# Patient Record
Sex: Female | Born: 1980 | Race: White | Hispanic: No | Marital: Single | Smoking: Former smoker
Health system: Southern US, Community
[De-identification: ages and names within clinical notes are randomized; demographics above are authoritative.]

## PROBLEM LIST (undated history)

## (undated) DIAGNOSIS — Z789 Other specified health status: Secondary | ICD-10-CM

## (undated) DIAGNOSIS — O26899 Other specified pregnancy related conditions, unspecified trimester: Secondary | ICD-10-CM

## (undated) DIAGNOSIS — Z6791 Unspecified blood type, Rh negative: Secondary | ICD-10-CM

## (undated) HISTORY — DX: Other specified pregnancy related conditions, unspecified trimester: O26.899

## (undated) HISTORY — DX: Unspecified blood type, rh negative: Z67.91

---

## 2009-11-12 ENCOUNTER — Ambulatory Visit: Payer: Self-pay | Admitting: Gynecology

## 2009-11-12 ENCOUNTER — Inpatient Hospital Stay (HOSPITAL_COMMUNITY): Admission: AD | Admit: 2009-11-12 | Discharge: 2009-11-12 | Payer: Self-pay | Admitting: Obstetrics & Gynecology

## 2010-04-16 ENCOUNTER — Ambulatory Visit (HOSPITAL_COMMUNITY): Admission: AD | Admit: 2010-04-16 | Discharge: 2010-04-16 | Payer: Self-pay | Admitting: Obstetrics and Gynecology

## 2010-06-30 ENCOUNTER — Inpatient Hospital Stay (HOSPITAL_COMMUNITY)
Admission: AD | Admit: 2010-06-30 | Discharge: 2010-07-02 | Payer: Self-pay | Source: Home / Self Care | Attending: Obstetrics and Gynecology | Admitting: Obstetrics and Gynecology

## 2010-07-01 LAB — CBC
HCT: 34.7 % — ABNORMAL LOW (ref 36.0–46.0)
Hemoglobin: 10.6 g/dL — ABNORMAL LOW (ref 12.0–15.0)
Hemoglobin: 7.9 g/dL — ABNORMAL LOW (ref 12.0–15.0)
MCH: 23.4 pg — ABNORMAL LOW (ref 26.0–34.0)
MCHC: 30.9 g/dL (ref 30.0–36.0)
MCV: 75.7 fL — ABNORMAL LOW (ref 78.0–100.0)
Platelets: 239 10*3/uL (ref 150–400)
Platelets: 182 10*3/uL (ref 150–400)
RBC: 3.38 MIL/uL — ABNORMAL LOW (ref 3.87–5.11)
RDW: 16.4 % — ABNORMAL HIGH (ref 11.5–15.5)
RDW: 16.2 % — ABNORMAL HIGH (ref 11.5–15.5)
WBC: 19.4 10*3/uL — ABNORMAL HIGH (ref 4.0–10.5)

## 2010-07-01 LAB — RPR: RPR Ser Ql: NONREACTIVE

## 2010-07-02 LAB — RH IMMUNE GLOB WKUP(>/=20WKS)(NOT WOMEN'S HOSP): Fetal Screen: NEGATIVE

## 2010-07-02 LAB — CBC
Hemoglobin: 8.4 g/dL — ABNORMAL LOW (ref 12.0–15.0)
MCH: 24.1 pg — ABNORMAL LOW (ref 26.0–34.0)
MCHC: 31.2 g/dL (ref 30.0–36.0)
Platelets: 187 10*3/uL (ref 150–400)
RDW: 16.5 % — ABNORMAL HIGH (ref 11.5–15.5)
WBC: 17 10*3/uL — ABNORMAL HIGH (ref 4.0–10.5)

## 2010-08-18 LAB — RH IMMUNE GLOBULIN WORKUP (NOT WOMEN'S HOSP): ABO/RH(D): O NEG

## 2010-08-24 LAB — URINE MICROSCOPIC-ADD ON

## 2010-08-24 LAB — WET PREP, GENITAL
Trich, Wet Prep: NONE SEEN
Yeast Wet Prep HPF POC: NONE SEEN

## 2010-08-24 LAB — HCG, QUANTITATIVE, PREGNANCY: hCG, Beta Chain, Quant, S: 31697 m[IU]/mL — ABNORMAL HIGH (ref <5)

## 2010-08-24 LAB — CBC
HCT: 41.7 % (ref 36.0–46.0)
RBC: 4.31 MIL/uL (ref 3.87–5.11)
RDW: 13.1 % (ref 11.5–15.5)

## 2010-08-24 LAB — URINALYSIS, ROUTINE W REFLEX MICROSCOPIC
Bilirubin Urine: NEGATIVE
Ketones, ur: NEGATIVE mg/dL
Urobilinogen, UA: 0.2 mg/dL (ref 0.0–1.0)

## 2010-08-24 LAB — URINE CULTURE

## 2010-08-24 LAB — RH IMMUNE GLOBULIN WORKUP (NOT WOMEN'S HOSP): Antibody Screen: NEGATIVE

## 2010-08-24 LAB — GC/CHLAMYDIA PROBE AMP, GENITAL
Chlamydia, DNA Probe: NEGATIVE
GC Probe Amp, Genital: NEGATIVE

## 2010-08-24 LAB — ABO/RH: ABO/RH(D): O NEG

## 2011-07-14 IMAGING — US US OB COMP LESS 14 WK
2 series · 14 of 28 positions shown · non-contrast
Comparison: none

OBSTETRICAL ULTRASOUND:
 This ultrasound exam was performed in the [HOSPITAL] Ultrasound Department.  The OB US report was generated in the AS system, and faxed to the ordering physician.  This report is also available in [HOSPITAL]?s AccessANYware and in [REDACTED] PACS.

[Series 1: us ob comp less 14 wks · 26 acquisitions, 10 frames shown (1 of 2)]
[im 2/26]
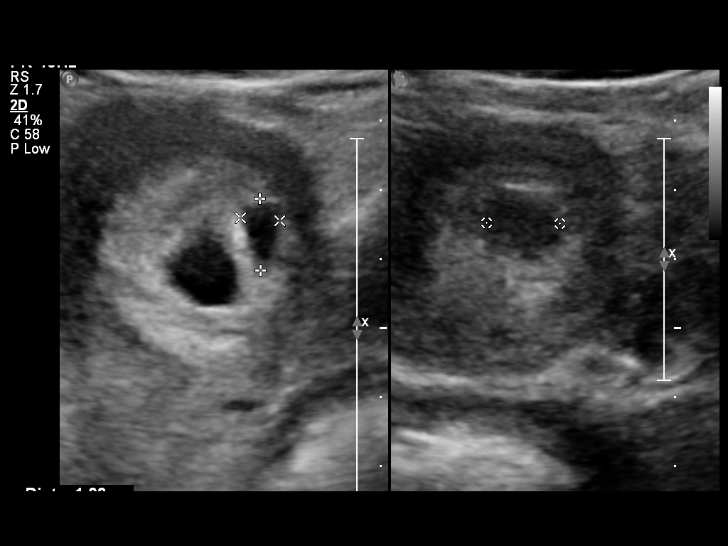
[im 4/26]
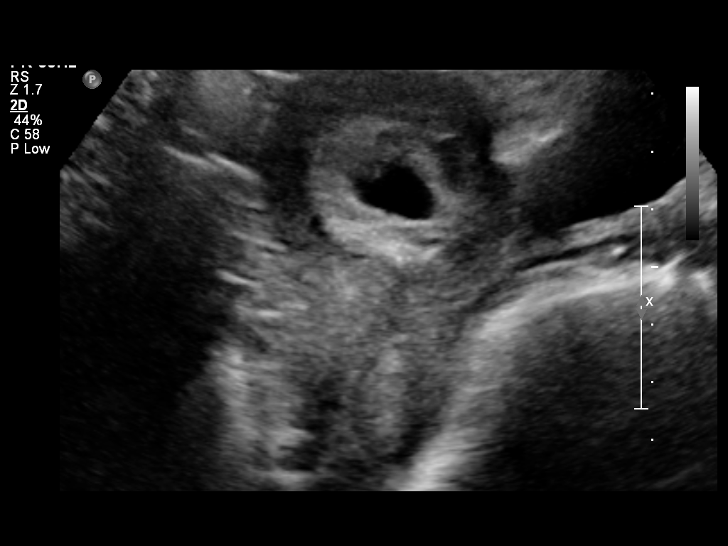
[im 7/26]
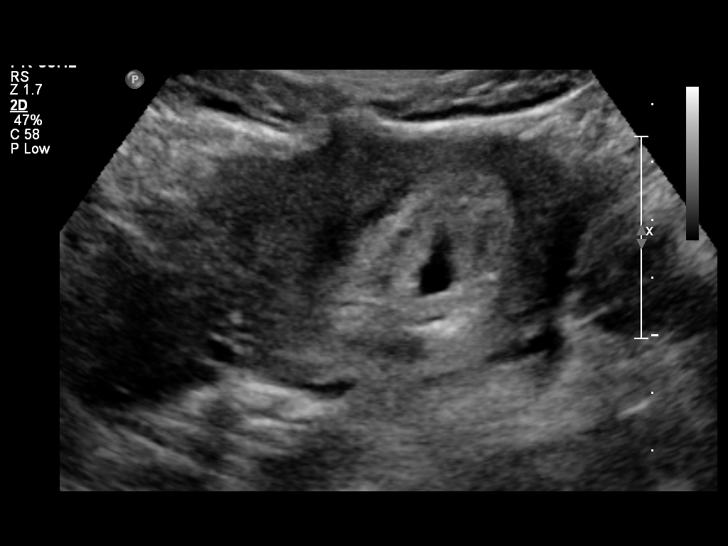
[im 9/26]
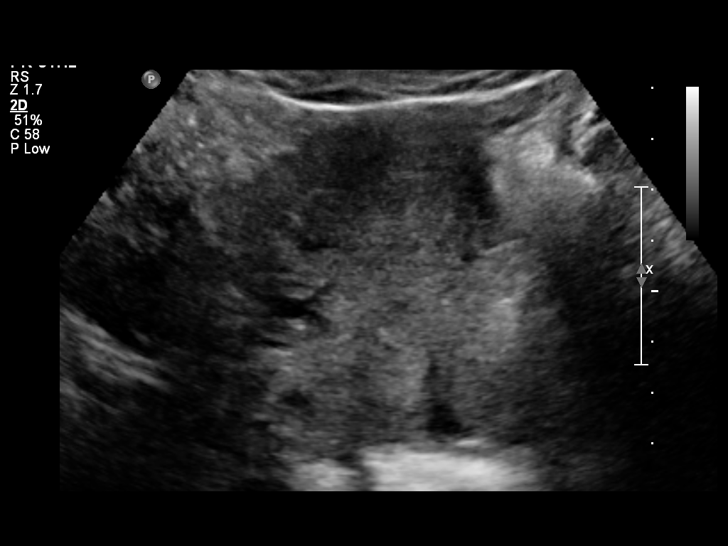
[im 12/26]
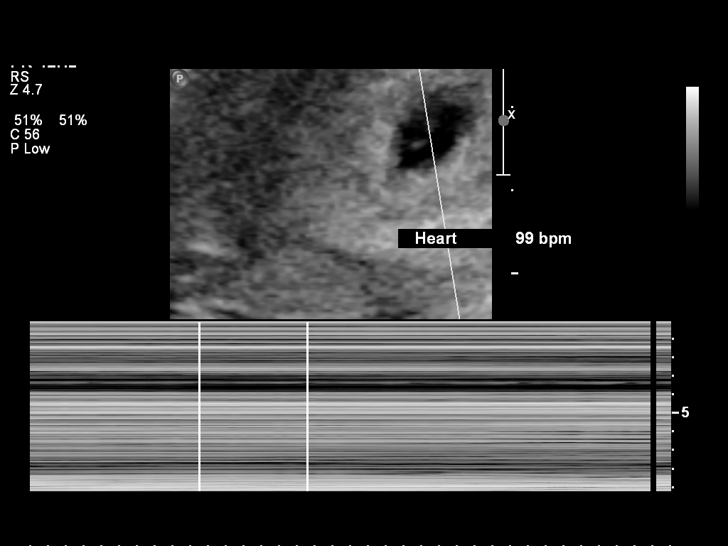
[im 14/26]
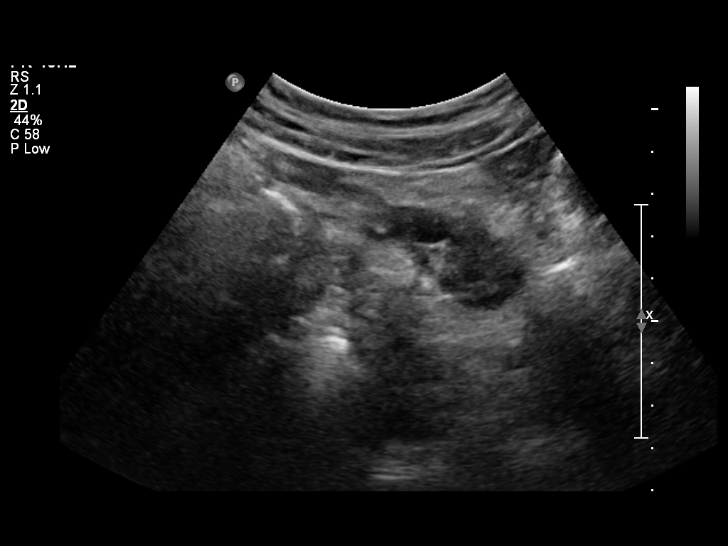
[im 17/26]
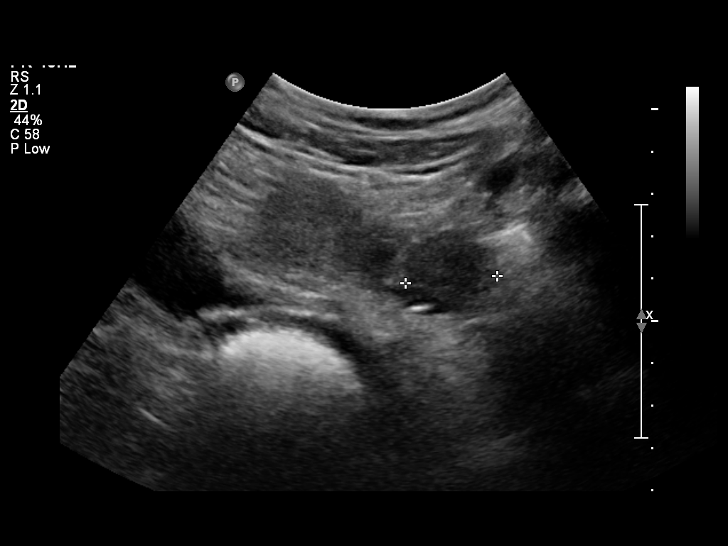
[im 19/26]
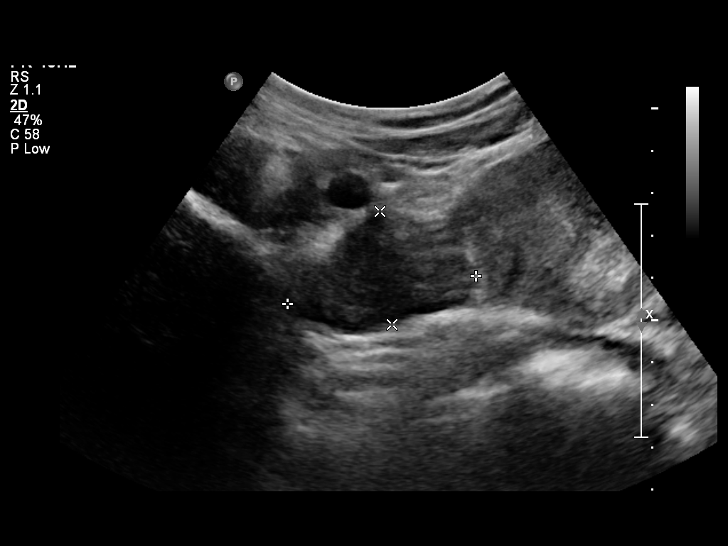
[im 22/26]
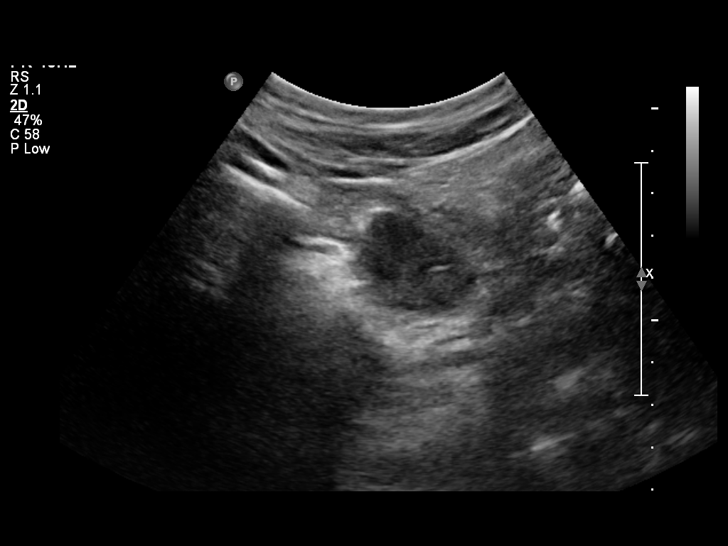
[im 24/26]
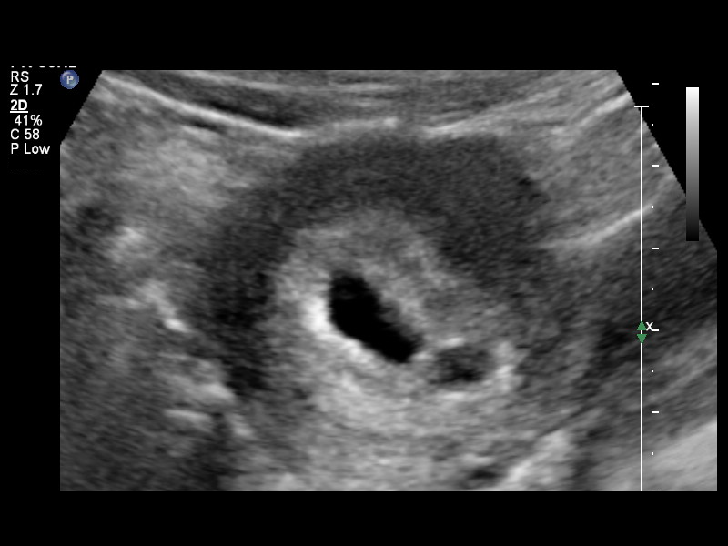

[Series 1: us ob comp less 14 wks · 4 of 8 slices shown (2 of 2)]
[im 1/8]
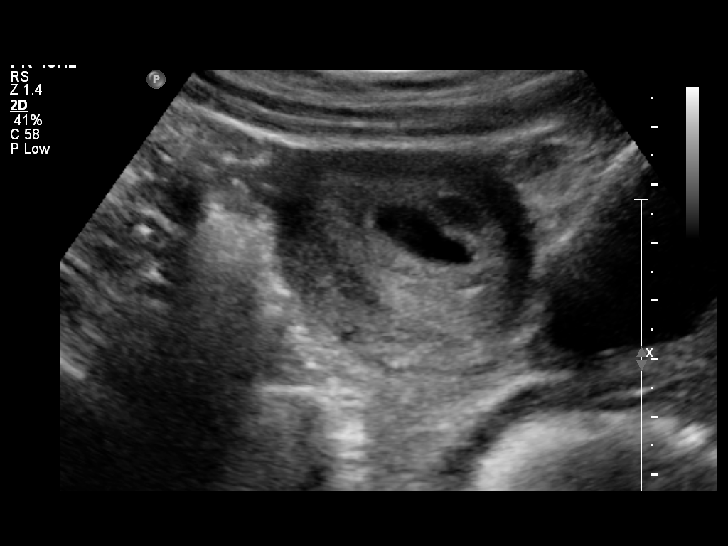
[im 3/8]
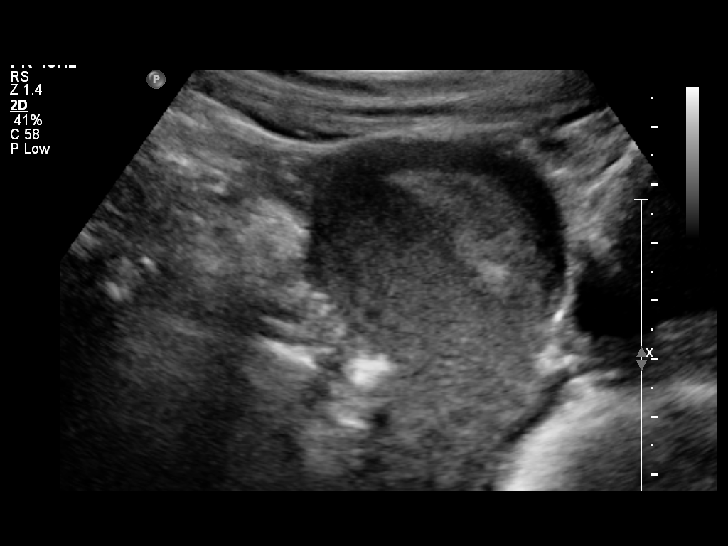
[im 5/8]
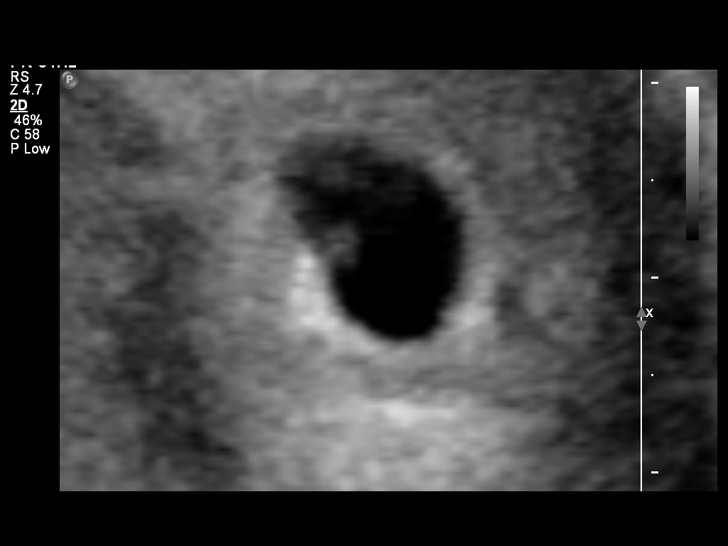
[im 8/8]
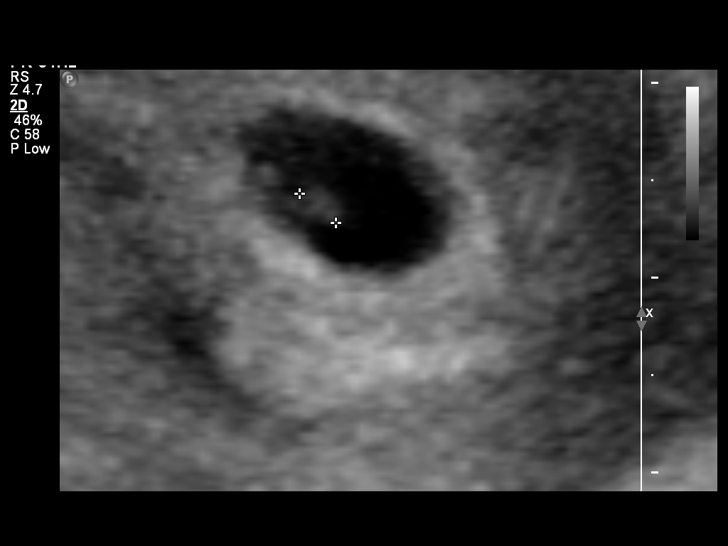

[14 of 28 positions shown; findings below may reference images not displayed]

IMPRESSION: See AS Obstetric US report.

## 2016-06-07 NOTE — L&D Delivery Note (Signed)
Delivery Note Patient pushed well for 10 minutes. At 3:44 PM a viable female was delivered via Vaginal, Spontaneous (Presentation: LOA).  APGAR: 8, 9; weight pending.   Placenta status:spontaneous and in tact.  Cord: 3V  with the following complications: None.  Cord pH: n/a  Anesthesia:  Epidural Episiotomy: None Lacerations: None Suture Repair: n/a Est. Blood Loss (mL): 100  Mom to postpartum.  Baby to Couplet care / Skin to Skin.  Blayke Cordrey GEFFEL Naiara Lombardozzi 06/02/2017, 4:16 PM

## 2016-10-28 ENCOUNTER — Ambulatory Visit (INDEPENDENT_AMBULATORY_CARE_PROVIDER_SITE_OTHER): Payer: Self-pay

## 2016-10-28 ENCOUNTER — Encounter: Payer: Self-pay | Admitting: Obstetrics

## 2016-10-28 DIAGNOSIS — Z3201 Encounter for pregnancy test, result positive: Secondary | ICD-10-CM

## 2016-10-28 DIAGNOSIS — Z6791 Unspecified blood type, Rh negative: Secondary | ICD-10-CM

## 2016-10-28 DIAGNOSIS — N926 Irregular menstruation, unspecified: Secondary | ICD-10-CM

## 2016-10-28 LAB — POCT URINE PREGNANCY: Preg Test, Ur: POSITIVE — AB

## 2016-10-28 NOTE — Progress Notes (Signed)
Nurse visit for UPT only pt states she does not have insurance. UPT positive.

## 2016-10-28 NOTE — Progress Notes (Deleted)
Ms. Olivia Summers presents today for UPT. She has no complaints today. LMP: 09/01/16    OBJECTIVE: Appears well, in no apparent distress.  OB History    No data available     Home UPT Result: Positive In-Office UPT result: Positive I have reviewed the patient's medical, obstetrical, social, and family histories, and medications.   ASSESSMENT: Positive pregnancy test  PLAN [facility] [place]  Prenatal Samples given

## 2016-11-24 LAB — OB RESULTS CONSOLE ABO/RH: RH Type: NEGATIVE

## 2016-11-24 LAB — OB RESULTS CONSOLE HEPATITIS B SURFACE ANTIGEN: Hepatitis B Surface Ag: NEGATIVE

## 2016-11-24 LAB — OB RESULTS CONSOLE RPR: RPR: NONREACTIVE

## 2016-11-24 LAB — OB RESULTS CONSOLE ANTIBODY SCREEN: ANTIBODY SCREEN: NEGATIVE

## 2016-11-24 LAB — OB RESULTS CONSOLE GC/CHLAMYDIA
CHLAMYDIA, DNA PROBE: NEGATIVE
GC PROBE AMP, GENITAL: NEGATIVE

## 2016-11-24 LAB — OB RESULTS CONSOLE HIV ANTIBODY (ROUTINE TESTING): HIV: NONREACTIVE

## 2016-11-24 LAB — OB RESULTS CONSOLE RUBELLA ANTIBODY, IGM: RUBELLA: IMMUNE

## 2017-05-11 LAB — OB RESULTS CONSOLE GBS: GBS: NEGATIVE

## 2017-06-01 ENCOUNTER — Telehealth (HOSPITAL_COMMUNITY): Payer: Self-pay | Admitting: *Deleted

## 2017-06-01 ENCOUNTER — Other Ambulatory Visit: Payer: Self-pay | Admitting: Obstetrics and Gynecology

## 2017-06-01 ENCOUNTER — Encounter (HOSPITAL_COMMUNITY): Payer: Self-pay | Admitting: *Deleted

## 2017-06-01 NOTE — Telephone Encounter (Signed)
Preadmission screen  

## 2017-06-02 ENCOUNTER — Inpatient Hospital Stay (HOSPITAL_COMMUNITY): Payer: Medicaid Other | Admitting: Anesthesiology

## 2017-06-02 ENCOUNTER — Inpatient Hospital Stay (HOSPITAL_COMMUNITY)
Admission: RE | Admit: 2017-06-02 | Discharge: 2017-06-03 | DRG: 807 | Disposition: A | Discharge status: Home / Self Care | Payer: Medicaid Other | Source: Ambulatory Visit | Attending: Obstetrics | Admitting: Obstetrics

## 2017-06-02 ENCOUNTER — Encounter (HOSPITAL_COMMUNITY): Payer: Self-pay

## 2017-06-02 ENCOUNTER — Other Ambulatory Visit: Payer: Self-pay

## 2017-06-02 DIAGNOSIS — O26893 Other specified pregnancy related conditions, third trimester: Principal | ICD-10-CM | POA: Diagnosis present

## 2017-06-02 DIAGNOSIS — Z87891 Personal history of nicotine dependence: Secondary | ICD-10-CM

## 2017-06-02 HISTORY — DX: Other specified health status: Z78.9

## 2017-06-02 LAB — CBC
HEMATOCRIT: 32.7 % — AB (ref 36.0–46.0)
HEMOGLOBIN: 10.3 g/dL — AB (ref 12.0–15.0)
MCH: 26 pg (ref 26.0–34.0)
MCHC: 31.5 g/dL (ref 30.0–36.0)
MCV: 82.6 fL (ref 78.0–100.0)
PLATELETS: 268 10*3/uL (ref 150–400)
RBC: 3.96 MIL/uL (ref 3.87–5.11)
RDW: 14.4 % (ref 11.5–15.5)
WBC: 13.7 10*3/uL — AB (ref 4.0–10.5)

## 2017-06-02 LAB — RPR: RPR Ser Ql: NONREACTIVE

## 2017-06-02 MED ORDER — VITAMIN K1 1 MG/0.5ML IJ SOLN
INTRAMUSCULAR | Status: AC
  Filled 2017-06-02: qty 0.5

## 2017-06-02 MED ORDER — DIPHENHYDRAMINE HCL 25 MG PO CAPS: 25.0000 mg | ORAL_CAPSULE | Freq: Four times a day (QID) | ORAL | Status: DC | PRN

## 2017-06-02 MED ORDER — ONDANSETRON HCL 4 MG/2ML IJ SOLN: 4.0000 mg | Freq: Four times a day (QID) | INTRAMUSCULAR | Status: DC | PRN

## 2017-06-02 MED ORDER — ACETAMINOPHEN 325 MG PO TABS: 650.0000 mg | ORAL_TABLET | ORAL | Status: DC | PRN

## 2017-06-02 MED ORDER — LACTATED RINGERS IV SOLN
INTRAVENOUS | Status: DC
  Administered 2017-06-02: 08:00:00 via INTRAVENOUS

## 2017-06-02 MED ORDER — OXYTOCIN 40 UNITS IN LACTATED RINGERS INFUSION - SIMPLE MED: 2.5000 [IU]/h | INTRAVENOUS | Status: DC

## 2017-06-02 MED ORDER — ONDANSETRON HCL 4 MG PO TABS: 4.0000 mg | ORAL_TABLET | ORAL | Status: DC | PRN

## 2017-06-02 MED ORDER — DIPHENHYDRAMINE HCL 50 MG/ML IJ SOLN: 12.5000 mg | INTRAMUSCULAR | Status: DC | PRN

## 2017-06-02 MED ORDER — OXYCODONE-ACETAMINOPHEN 5-325 MG PO TABS: 2.0000 | ORAL_TABLET | ORAL | Status: DC | PRN

## 2017-06-02 MED ORDER — SIMETHICONE 80 MG PO CHEW: 80.0000 mg | CHEWABLE_TABLET | ORAL | Status: DC | PRN

## 2017-06-02 MED ORDER — OXYCODONE-ACETAMINOPHEN 5-325 MG PO TABS: 1.0000 | ORAL_TABLET | ORAL | Status: DC | PRN

## 2017-06-02 MED ORDER — BENZOCAINE-MENTHOL 20-0.5 % EX AERO: 1.0000 "application " | INHALATION_SPRAY | CUTANEOUS | Status: DC | PRN

## 2017-06-02 MED ORDER — COCONUT OIL OIL: 1.0000 "application " | TOPICAL_OIL | Status: DC | PRN

## 2017-06-02 MED ORDER — OXYTOCIN BOLUS FROM INFUSION
500.0000 mL | Freq: Once | INTRAVENOUS | Status: AC
  Administered 2017-06-02: 500 mL via INTRAVENOUS

## 2017-06-02 MED ORDER — WITCH HAZEL-GLYCERIN EX PADS: 1.0000 "application " | MEDICATED_PAD | CUTANEOUS | Status: DC | PRN

## 2017-06-02 MED ORDER — PHENYLEPHRINE 40 MCG/ML (10ML) SYRINGE FOR IV PUSH (FOR BLOOD PRESSURE SUPPORT)
80.0000 ug | PREFILLED_SYRINGE | INTRAVENOUS | Status: DC | PRN
  Filled 2017-06-02: qty 5

## 2017-06-02 MED ORDER — OXYTOCIN 40 UNITS IN LACTATED RINGERS INFUSION - SIMPLE MED
1.0000 m[IU]/min | INTRAVENOUS | Status: DC
  Administered 2017-06-02: 2 m[IU]/min via INTRAVENOUS
  Filled 2017-06-02: qty 1000

## 2017-06-02 MED ORDER — LIDOCAINE HCL (PF) 1 % IJ SOLN
INTRAMUSCULAR | Status: DC | PRN
  Administered 2017-06-02: 4 mL
  Administered 2017-06-02: 6 mL via EPIDURAL

## 2017-06-02 MED ORDER — SENNOSIDES-DOCUSATE SODIUM 8.6-50 MG PO TABS
2.0000 | ORAL_TABLET | ORAL | Status: DC
  Administered 2017-06-02: 2 via ORAL
  Filled 2017-06-02: qty 2

## 2017-06-02 MED ORDER — PHENYLEPHRINE 40 MCG/ML (10ML) SYRINGE FOR IV PUSH (FOR BLOOD PRESSURE SUPPORT)
80.0000 ug | PREFILLED_SYRINGE | INTRAVENOUS | Status: DC | PRN
  Filled 2017-06-02: qty 10
  Filled 2017-06-02: qty 5

## 2017-06-02 MED ORDER — OXYCODONE HCL 5 MG PO TABS: 10.0000 mg | ORAL_TABLET | ORAL | Status: DC | PRN

## 2017-06-02 MED ORDER — ONDANSETRON HCL 4 MG/2ML IJ SOLN: 4.0000 mg | INTRAMUSCULAR | Status: DC | PRN

## 2017-06-02 MED ORDER — SOD CITRATE-CITRIC ACID 500-334 MG/5ML PO SOLN: 30.0000 mL | ORAL | Status: DC | PRN

## 2017-06-02 MED ORDER — EPHEDRINE 5 MG/ML INJ
10.0000 mg | INTRAVENOUS | Status: DC | PRN
  Filled 2017-06-02: qty 2

## 2017-06-02 MED ORDER — LACTATED RINGERS IV SOLN
500.0000 mL | INTRAVENOUS | Status: DC | PRN
Start: 2017-06-02 — End: 2017-06-02

## 2017-06-02 MED ORDER — DIBUCAINE 1 % RE OINT: 1.0000 "application " | TOPICAL_OINTMENT | RECTAL | Status: DC | PRN

## 2017-06-02 MED ORDER — FENTANYL 2.5 MCG/ML BUPIVACAINE 1/10 % EPIDURAL INFUSION (WH - ANES)
14.0000 mL/h | INTRAMUSCULAR | Status: DC | PRN
  Administered 2017-06-02: 14 mL/h via EPIDURAL
  Filled 2017-06-02: qty 100

## 2017-06-02 MED ORDER — PRENATAL MULTIVITAMIN CH
1.0000 | ORAL_TABLET | Freq: Every day | ORAL | Status: DC
  Administered 2017-06-03: 1 via ORAL
  Filled 2017-06-02: qty 1

## 2017-06-02 MED ORDER — LACTATED RINGERS IV SOLN: 500.0000 mL | Freq: Once | INTRAVENOUS | Status: DC

## 2017-06-02 MED ORDER — OXYCODONE HCL 5 MG PO TABS: 5.0000 mg | ORAL_TABLET | ORAL | Status: DC | PRN

## 2017-06-02 MED ORDER — IBUPROFEN 600 MG PO TABS
600.0000 mg | ORAL_TABLET | Freq: Four times a day (QID) | ORAL | Status: DC
  Administered 2017-06-02 – 2017-06-03 (×4): 600 mg via ORAL
  Filled 2017-06-02 (×4): qty 1

## 2017-06-02 MED ORDER — LIDOCAINE HCL (PF) 1 % IJ SOLN
30.0000 mL | INTRAMUSCULAR | Status: DC | PRN
  Filled 2017-06-02: qty 30

## 2017-06-02 MED ORDER — TETANUS-DIPHTH-ACELL PERTUSSIS 5-2.5-18.5 LF-MCG/0.5 IM SUSP: 0.5000 mL | Freq: Once | INTRAMUSCULAR | Status: DC

## 2017-06-02 MED ORDER — TERBUTALINE SULFATE 1 MG/ML IJ SOLN
0.2500 mg | Freq: Once | INTRAMUSCULAR | Status: DC | PRN
  Filled 2017-06-02: qty 1

## 2017-06-02 MED ORDER — EPHEDRINE 5 MG/ML INJ
10.0000 mg | INTRAVENOUS | Status: DC | PRN
Start: 2017-06-02 — End: 2017-06-02
  Filled 2017-06-02: qty 2

## 2017-06-02 NOTE — Lactation Note (Signed)
This note was copied from a baby's chart. Lactation Consultation Note  Patient Name: Olivia Summers Today's Date: 06/02/2017 Reason for consult: Initial assessment   P3, Baby 4 hours old.  Ex BF for 1.5 & 2 years. Oral assessment indicated short anterior labial frenulum. Older brother had a frenotomy as an infant. Reviewed hand expression with drops expressed. Reviewed spoon feeding. Assisted w/ latching in cross cradle, cradle and football positions. Most of the time, mother felt comfortable w/ latch and at times she felt pinching. Encouraged parents to discuss frenulum w/ Ped MD. Provided family w/ resource sheet. Mom encouraged to feed baby 8-12 times/24 hours and with feeding cues.  Mom made aware of O/P services, breastfeeding support groups, community resources, and our phone # for post-discharge questions.     Maternal Data Has patient been taught Hand Expression?: Yes Does the patient have breastfeeding experience prior to this delivery?: Yes  Feeding Feeding Type: Breast Fed Length of feed: 10 min  LATCH Score Latch: Grasps breast easily, tongue down, lips flanged, rhythmical sucking.  Audible Swallowing: A few with stimulation  Type of Nipple: Everted at rest and after stimulation  Comfort (Breast/Nipple): Soft / non-tender  Hold (Positioning): Assistance needed to correctly position infant at breast and maintain latch.  LATCH Score: 8  Interventions Interventions: Breast feeding basics reviewed;Assisted with latch;Skin to skin;Breast massage;Hand express;Breast compression;Support pillows;Position options  Lactation Tools Discussed/Used     Consult Status Consult Status: Follow-up Date: 06/03/17 Follow-up type: In-patient    Dahlia ByesBerkelhammer, Ruth [facility] 06/02/2017, 7:51 PM

## 2017-06-02 NOTE — Anesthesia Pain Management Evaluation Note (Signed)
  CRNA Pain Management Visit Note  Patient: Olivia Summers, 36 y.o., female  "Hello I am a member of the anesthesia team at [facility]. We have an anesthesia team available at all times to provide care throughout the hospital, including epidural management and anesthesia for C-section. I don't know your plan for the delivery whether it a natural birth, water birth, IV sedation, nitrous supplementation, doula or epidural, but we want to meet your pain goals."   1.Was your pain managed to your expectations on prior hospitalizations?   Yes   2.What is your expectation for pain management during this hospitalization?     Epidural  3.How can we help you reach that goal? epidural  Record the patient's initial score and the patient's pain goal.   Pain: 0  Pain Goal: 4 [facility] wants you to be able to say your pain was always managed very well.  Keyna Blizard 06/02/2017

## 2017-06-02 NOTE — Anesthesia Preprocedure Evaluation (Signed)
Anesthesia Evaluation  Patient identified by MRN, date of birth, ID band Patient awake    Reviewed: Allergy & Precautions, H&P , Patient's Chart, lab work & pertinent test results  Airway Mallampati: II  TM Distance: >3 FB Neck ROM: full    Dental  (+) Teeth Intact   Pulmonary former smoker,    breath sounds clear to auscultation       Cardiovascular  Rhythm:regular Rate:Normal     Neuro/Psych    GI/Hepatic   Endo/Other    Renal/GU      Musculoskeletal   Abdominal   Peds  Hematology   Anesthesia Other Findings       Reproductive/Obstetrics (+) Pregnancy                             Anesthesia Physical  Anesthesia Plan  ASA: II  Anesthesia Plan: Epidural   Post-op Pain Management:    Induction:   PONV Risk Score and Plan:   Airway Management Planned:   Additional Equipment:   Intra-op Plan:   Post-operative Plan:   Informed Consent: I have reviewed the patients History and Physical, chart, labs and discussed the procedure including the risks, benefits and alternatives for the proposed anesthesia with the patient or authorized representative who has indicated his/her understanding and acceptance.   Dental Advisory Given  Plan Discussed with:   Anesthesia Plan Comments: (Labs checked- platelets confirmed with RN in room. Fetal heart tracing, per RN, reported to be stable enough for sitting procedure. Discussed epidural, and patient consents to the procedure:  included risk of possible headache,backache, failed block, allergic reaction, and nerve injury. This patient was asked if she had any questions or concerns before the procedure started.)        Anesthesia Quick Evaluation  

## 2017-06-02 NOTE — Anesthesia Procedure Notes (Signed)
Epidural Patient location during procedure: OB  Staffing Anesthesiologist: Finn Altemose, MD  Preanesthetic Checklist Completed: patient identified, pre-op evaluation, timeout performed, IV checked, risks and benefits discussed and monitors and equipment checked  Epidural Patient position: sitting Prep: DuraPrep Patient monitoring: blood pressure and continuous pulse ox Approach: right paramedian Location: L3-L4 Injection technique: LOR air  Needle:  Needle type: Tuohy  Needle gauge: 17 G Needle insertion depth: 5 cm Catheter type: closed end flexible Catheter size: 19 Gauge Catheter at skin depth: 10 cm Test dose: negative  Assessment Sensory level: T8  Additional Notes          

## 2017-06-02 NOTE — H&P (Signed)
[age] y.o. [id number] @ 6347w1d presents for  IOL for AMA.  Otherwise has good fetal movement and no bleeding.  Past Medical History:  Diagnosis Date  . Medical history non-contributory   . Rh negative, antepartum    History reviewed. No pertinent surgical history.  OB History  Gravida Para Term Preterm AB Living  3 2 2     2   SAB TAB Ectopic Multiple Live Births          2    # Outcome Date GA Lbr Len/2nd Weight Sex Delivery Anes PTL Lv  3 Current           2 Term 06/30/10 7644w0d  3.459 kg (7 lb 10 oz) F Vag-Spont EPI  LIV  1 Term 07/11/08 8643w0d  3.657 kg (8 lb 1 oz) M Vag-Spont EPI  LIV      Social History   Socioeconomic History  . Marital status: Single    Spouse name: Not on file  . Number of children: Not on file  . Years of education: Not on file  . Highest education level: Not on file  Tobacco Use  . Smoking status: Former Smoker    Types: Cigarettes    Last attempt to quit: 10/06/2016    Years since quitting: 0.6  . Smokeless tobacco: Never Used  Substance and Sexual Activity  . Alcohol use: Yes    Comment: socially, prior ot +UPT  . Drug use: No  . Sexual activity: Yes    Birth control/protection: None  Other Topics Concern  . Not on file  Social History Narrative  . Not on file   Patient has no known allergies.    Prenatal Transfer Tool  Maternal Diabetes: No Genetic Screening: Normal Maternal Ultrasounds/Referrals: Normal Fetal Ultrasounds or other Referrals:  None Maternal Substance Abuse:  No Significant Maternal Medications:  None Significant Maternal Lab Results: Lab values include: Group B Strep negative  ABO, Rh: O/Negative/-- (06/20 0000) Antibody: Negative (06/20 0000) Rubella: Immune (06/20 0000) RPR: Nonreactive (06/20 0000)  HBsAg: Negative (06/20 0000)  HIV: Non-reactive (06/20 0000)  GBS: Negative (12/05 0000)     Other PNC: uncomplicated.    Vitals:   06/02/17 0804 06/02/17 0818  BP: 124/79   Pulse: 97   Resp: 18   Temp:  98.1  F (36.7 C)     General:  NAD Abdomen:  soft, gravid, EFW 7.5# Ex:  no edema SVE:  3/50/-2 FHTs:  140s, mod var, + accels, cat1 Toco:  quiet   A/P   36 y.o. [id number] 2947w1d presents with IOL for AMA Admit to L&D IOL with pitocin Epidural upon request  FSR/ vtx/ GBS neg  [facility] Inc[facility]NA GEFFEL Tarin Navarez

## 2017-06-02 NOTE — Progress Notes (Signed)
Increasing discomfort w ctx  Toco--irregular ctx EFM: 130s, mod var, + accels, cat 1 SVE: 4/50/-2, AROM clear fluid  A/P:  g3p2 @ 2536w1d w IOL for AMA Cont pitocin FSR/vtx / gbs neg

## 2017-06-03 ENCOUNTER — Ambulatory Visit: Payer: Self-pay

## 2017-06-03 LAB — CBC
HEMATOCRIT: 31 % — AB (ref 36.0–46.0)
HEMOGLOBIN: 9.9 g/dL — AB (ref 12.0–15.0)
MCH: 26.2 pg (ref 26.0–34.0)
MCHC: 31.9 g/dL (ref 30.0–36.0)
MCV: 82 fL (ref 78.0–100.0)
Platelets: 225 10*3/uL (ref 150–400)
RBC: 3.78 MIL/uL — AB (ref 3.87–5.11)
RDW: 14.6 % (ref 11.5–15.5)
WBC: 16.9 10*3/uL — ABNORMAL HIGH (ref 4.0–10.5)

## 2017-06-03 MED ORDER — RHO D IMMUNE GLOBULIN 1500 UNIT/2ML IJ SOSY
300.0000 ug | PREFILLED_SYRINGE | Freq: Once | INTRAMUSCULAR | Status: AC
  Administered 2017-06-03: 300 ug via INTRAVENOUS
  Filled 2017-06-03: qty 2

## 2017-06-03 NOTE — Lactation Note (Signed)
This note was copied from a baby's chart. Lactation Consultation Note  Patient Name: Olivia Summers ZOXWR'UToday's Date: 06/03/2017 Reason for consult: Follow-up assessment;Term(early D/C, mom packed up for D/C and ready to go. see LC  note )  Per mom having some discomfort with latching.  LC reviewed basics and recommended prior to latch - breast massage , hand express, pre- pump with hand pump to prime  Milk ducts, reverse pressure,latch with firm support and breast compressions until swallows and then intermittent.  Shells between feedings except when sleeping to enhance a deeper latch.  Sore nipple and engorgement prevention and tx reviewed.  LC instructed mom on the use of shells and hand pump.  Mother informed of post-discharge support and given phone number to the lactation department, including services for phone call assistance; out-patient appointments; and breastfeeding support group. List of other breastfeeding resources in the community given in the handout. Encouraged mother to call for problems or concerns related to breastfeeding.    Maternal Data Has patient been taught Hand Expression?: (per mom feels comfortable )  Feeding    LATCH Score                   Interventions Interventions: Breast feeding basics reviewed;Hand pump;Shells  Lactation Tools Discussed/Used Tools: Shells;Pump Shell Type: Inverted Breast pump type: Manual Pump Review: Setup, frequency, and cleaning Initiated by:: MAI  Date initiated:: 06/03/17   Consult Status Consult Status: Complete Date: 06/03/17    Olivia Summers 06/03/2017, 6:19 PM

## 2017-06-03 NOTE — Discharge Summary (Signed)
Obstetric Discharge Summary Reason for Admission: induction of labor Prenatal Procedures: NST and ultrasound Intrapartum Procedures: spontaneous vaginal delivery Postpartum Procedures: none Complications-Operative and Postpartum: none Hemoglobin  Date Value Ref Range Status  06/03/2017 9.9 (L) 12.0 - 15.0 g/dL Final   HCT  Date Value Ref Range Status  06/03/2017 31.0 (L) 36.0 - 46.0 % Final    Physical Exam:  General: alert and cooperative Lochia: appropriate Uterine Fundus: firm  Discharge Diagnoses: Term Pregnancy-delivered and AMA  Discharge Information: Date: 06/03/2017 Activity: pelvic rest Diet: routine Medications: PNV and Ibuprofen Condition: stable Instructions: refer to practice specific booklet Discharge to: home Follow-up Information    Marlow Baarslark, Dyanna, MD. Schedule an appointment as soon as possible for a visit in 1 month(s).   Specialty:  Obstetrics Contact information: [address] [place] [address] [phone]           Newborn Data: Live born female  Birth Weight: 7 lb 3.9 oz (3286 g) APGAR: 8, 9  Newborn Delivery   Birth date/time:  06/02/2017 15:44:00 Delivery type:  Vaginal, Spontaneous     Home with mother.  Olivia Summers 06/03/2017, 1:52 PM

## 2017-06-03 NOTE — Anesthesia Postprocedure Evaluation (Signed)
Anesthesia Post Note  Patient: Olivia Summers  Procedure(s) Performed: AN AD HOC LABOR EPIDURAL     Patient location during evaluation: Mother Baby Anesthesia Type: Epidural Level of consciousness: awake, awake and alert, oriented and patient cooperative Pain management: pain level controlled Vital Signs Assessment: post-procedure vital signs reviewed and stable Respiratory status: spontaneous breathing, nonlabored ventilation and respiratory function stable Cardiovascular status: stable Postop Assessment: no headache, no backache, patient able to bend at knees and no apparent nausea or vomiting Anesthetic complications: no    Last Vitals:  Vitals:   06/02/17 1823 06/02/17 2235  BP: 110/66 99/61  Pulse: 73 75  Resp: 18 18  Temp: 36.6 C 36.5 C  SpO2: 100% 99%    Last Pain:  Vitals:   06/03/17 0530  TempSrc:   PainSc: 2    Pain Goal:                 Ayana Imhof L

## 2017-06-03 NOTE — Progress Notes (Signed)
PPD#1 Pt is doing well . She would like to go home. Lochia wnl VSSAF IMP/ Stable  Plan/ Will discharge

## 2017-06-04 LAB — RH IG WORKUP (INCLUDES ABO/RH)
ABO/RH(D): O NEG
Fetal Screen: NEGATIVE
Gestational Age(Wks): 39
Unit division: 0

## 2017-06-06 LAB — BPAM RBC
BLOOD PRODUCT EXPIRATION DATE: 201901152359
Blood Product Expiration Date: 201901192359
Unit Type and Rh: 9500
Unit Type and Rh: 9500

## 2017-06-06 LAB — TYPE AND SCREEN
ABO/RH(D): O NEG
Antibody Screen: POSITIVE
UNIT DIVISION: 0
UNIT DIVISION: 0

## 2024-07-10 ENCOUNTER — Other Ambulatory Visit: Payer: Self-pay

## 2024-07-10 DIAGNOSIS — N6320 Unspecified lump in the left breast, unspecified quadrant: Secondary | ICD-10-CM

## 2024-07-19 ENCOUNTER — Ambulatory Visit

## 2024-07-19 ENCOUNTER — Encounter

## 2024-07-19 ENCOUNTER — Other Ambulatory Visit
